# Patient Record
Sex: Female | Born: 1935 | Race: White | Hispanic: No | Marital: Married | Smoking: Never smoker
Health system: Southern US, Community
[De-identification: ages and names within clinical notes are randomized; demographics above are authoritative.]

## PROBLEM LIST (undated history)

## (undated) DIAGNOSIS — C50919 Malignant neoplasm of unspecified site of unspecified female breast: Secondary | ICD-10-CM

## (undated) DIAGNOSIS — E7800 Pure hypercholesterolemia, unspecified: Secondary | ICD-10-CM

## (undated) DIAGNOSIS — I10 Essential (primary) hypertension: Secondary | ICD-10-CM

## (undated) HISTORY — DX: Pure hypercholesterolemia, unspecified: E78.00

## (undated) HISTORY — PX: ABDOMINAL HYSTERECTOMY: SHX81

## (undated) HISTORY — DX: Essential (primary) hypertension: I10

## (undated) HISTORY — DX: Malignant neoplasm of unspecified site of unspecified female breast: C50.919

---

## 2011-08-09 HISTORY — PX: BREAST LUMPECTOMY: SHX2

## 2011-08-09 HISTORY — PX: BREAST BIOPSY: SHX20

## 2011-09-09 DIAGNOSIS — C50919 Malignant neoplasm of unspecified site of unspecified female breast: Secondary | ICD-10-CM

## 2011-09-09 HISTORY — DX: Malignant neoplasm of unspecified site of unspecified female breast: C50.919

## 2011-10-07 ENCOUNTER — Ambulatory Visit: Payer: Self-pay | Admitting: Oncology

## 2011-10-14 ENCOUNTER — Ambulatory Visit: Payer: Self-pay | Admitting: Internal Medicine

## 2011-11-07 ENCOUNTER — Ambulatory Visit: Payer: Self-pay | Admitting: Oncology

## 2011-11-07 ENCOUNTER — Ambulatory Visit: Payer: Self-pay | Admitting: Internal Medicine

## 2011-11-10 LAB — CBC CANCER CENTER
Basophil #: 0 x10 3/mm (ref 0.0–0.1)
Eosinophil #: 0.2 x10 3/mm (ref 0.0–0.7)
Eosinophil %: 2.9 %
HGB: 13.1 g/dL (ref 12.0–16.0)
Lymphocyte #: 1.2 x10 3/mm (ref 1.0–3.6)
MCH: 33 pg (ref 26.0–34.0)
MCHC: 34.5 g/dL (ref 32.0–36.0)
MCV: 96 fL (ref 80–100)
Monocyte #: 0.6 x10 3/mm (ref 0.0–0.7)
Neutrophil #: 4 x10 3/mm (ref 1.4–6.5)
Platelet: 263 x10 3/mm (ref 150–440)
RBC: 3.97 10*6/uL (ref 3.80–5.20)
RDW: 12.9 % (ref 11.5–14.5)

## 2011-11-17 LAB — CBC CANCER CENTER
Basophil #: 0 x10 3/mm (ref 0.0–0.1)
HCT: 36.2 % (ref 35.0–47.0)
HGB: 12.5 g/dL (ref 12.0–16.0)
Lymphocyte #: 1.1 x10 3/mm (ref 1.0–3.6)
MCH: 32.8 pg (ref 26.0–34.0)
Neutrophil #: 4.2 x10 3/mm (ref 1.4–6.5)
Platelet: 254 x10 3/mm (ref 150–440)
RBC: 3.8 10*6/uL (ref 3.80–5.20)
WBC: 6.2 x10 3/mm (ref 3.6–11.0)

## 2011-11-24 LAB — CBC CANCER CENTER
Basophil #: 0 x10 3/mm (ref 0.0–0.1)
Basophil %: 0.7 %
Eosinophil #: 0.2 x10 3/mm (ref 0.0–0.7)
Eosinophil %: 2.6 %
HGB: 12.4 g/dL (ref 12.0–16.0)
Lymphocyte %: 14.3 %
MCHC: 33.8 g/dL (ref 32.0–36.0)
Monocyte #: 0.8 x10 3/mm (ref 0.2–0.9)
Neutrophil #: 4.4 x10 3/mm (ref 1.4–6.5)
Neutrophil %: 70.2 %
RBC: 3.84 10*6/uL (ref 3.80–5.20)
RDW: 12.7 % (ref 11.5–14.5)
WBC: 6.3 x10 3/mm (ref 3.6–11.0)

## 2011-12-07 ENCOUNTER — Ambulatory Visit: Payer: Self-pay | Admitting: Internal Medicine

## 2011-12-07 ENCOUNTER — Ambulatory Visit: Payer: Self-pay | Admitting: Oncology

## 2011-12-08 LAB — CBC CANCER CENTER
Basophil #: 0 x10 3/mm (ref 0.0–0.1)
Basophil %: 0.7 %
Eosinophil #: 0.1 x10 3/mm (ref 0.0–0.7)
Eosinophil %: 2.9 %
HCT: 39.3 % (ref 35.0–47.0)
Lymphocyte %: 14.6 %
MCH: 32.3 pg (ref 26.0–34.0)
Monocyte %: 10.9 %
Neutrophil #: 3.6 x10 3/mm (ref 1.4–6.5)
Neutrophil %: 70.9 %
Platelet: 220 x10 3/mm (ref 150–440)
RDW: 12.7 % (ref 11.5–14.5)
WBC: 5 x10 3/mm (ref 3.6–11.0)

## 2012-01-07 ENCOUNTER — Ambulatory Visit: Payer: Self-pay | Admitting: Oncology

## 2012-01-07 ENCOUNTER — Ambulatory Visit: Payer: Self-pay | Admitting: Internal Medicine

## 2012-02-06 ENCOUNTER — Ambulatory Visit: Payer: Self-pay | Admitting: Internal Medicine

## 2012-04-11 ENCOUNTER — Ambulatory Visit: Payer: Self-pay | Admitting: Oncology

## 2012-05-08 ENCOUNTER — Ambulatory Visit: Payer: Self-pay | Admitting: Oncology

## 2012-06-08 ENCOUNTER — Ambulatory Visit: Payer: Self-pay | Admitting: Oncology

## 2012-07-25 ENCOUNTER — Ambulatory Visit: Payer: Self-pay | Admitting: Oncology

## 2012-08-08 ENCOUNTER — Ambulatory Visit: Payer: Self-pay | Admitting: Oncology

## 2012-09-12 ENCOUNTER — Ambulatory Visit: Payer: Self-pay | Admitting: Oncology

## 2012-10-31 ENCOUNTER — Ambulatory Visit: Payer: Self-pay | Admitting: Oncology

## 2012-11-06 ENCOUNTER — Ambulatory Visit: Payer: Self-pay | Admitting: Oncology

## 2012-12-06 ENCOUNTER — Ambulatory Visit: Payer: Self-pay | Admitting: Oncology

## 2013-02-13 ENCOUNTER — Ambulatory Visit: Payer: Self-pay | Admitting: Oncology

## 2013-03-08 ENCOUNTER — Ambulatory Visit: Payer: Self-pay | Admitting: Oncology

## 2013-06-13 IMAGING — CT CT SIM MISC
1 series · 16 of 33 positions shown, 20 images · non-contrast
Comparison: none

[Series 2: — · axial · 1.22mm/px · z∈[-880,-526]mm · 16 of 108 slices shown, 20 images]
[im 4/108  mediastinal]
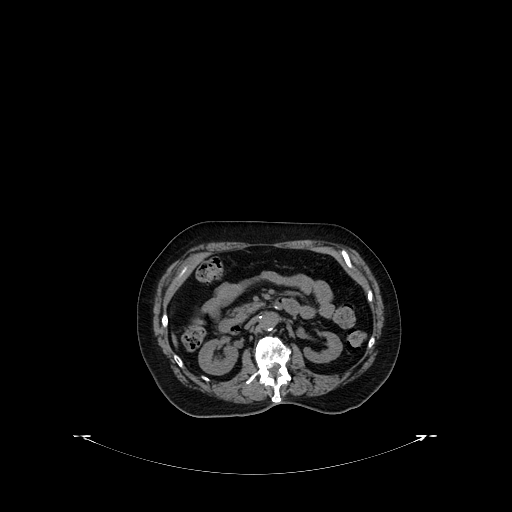
[im 4/108  lung]
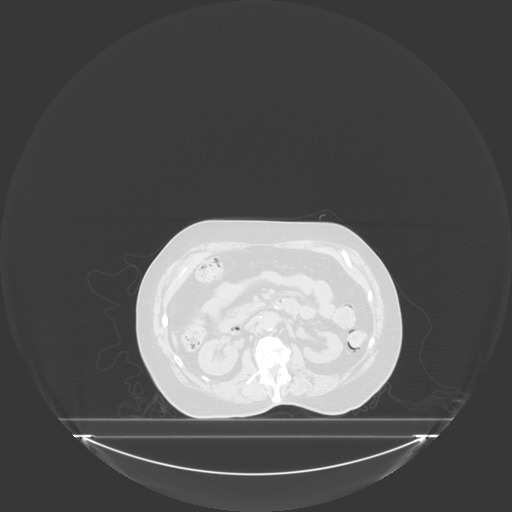
[im 12/108  lung]
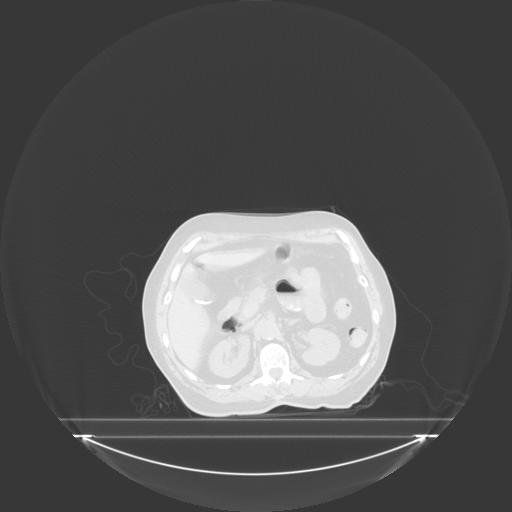
[im 20/108  lung]
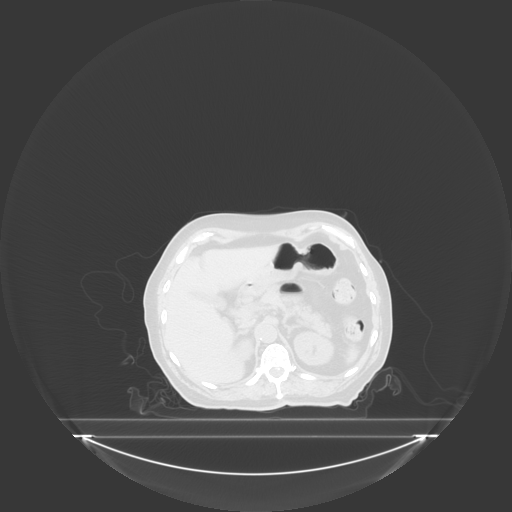
[im 24/108  lung]
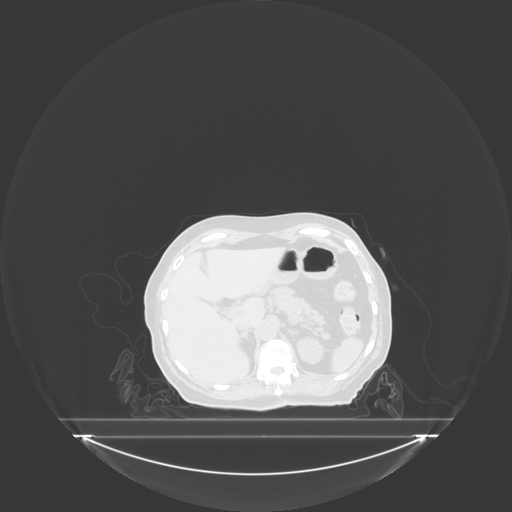
[im 32/108  mediastinal]
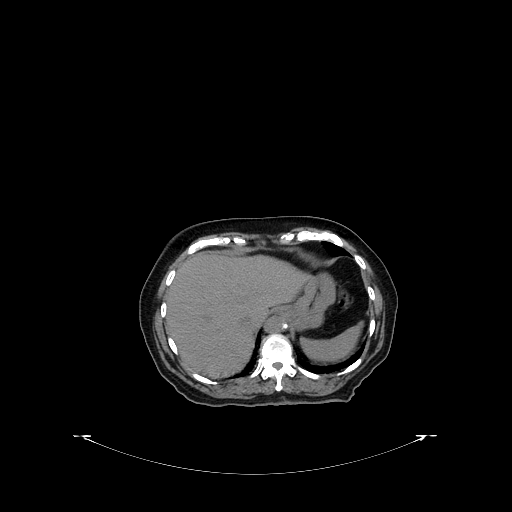
[im 32/108  lung]
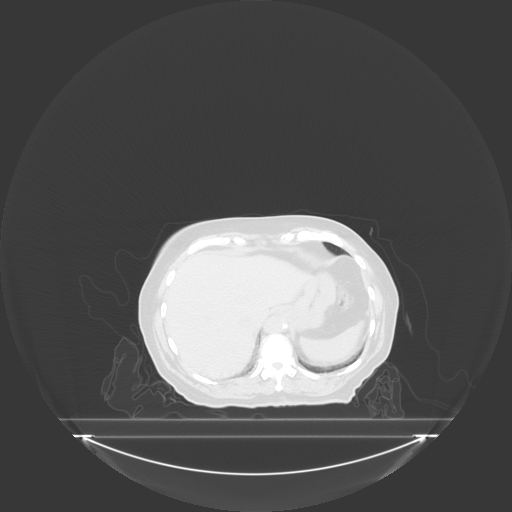
[im 40/108  lung]
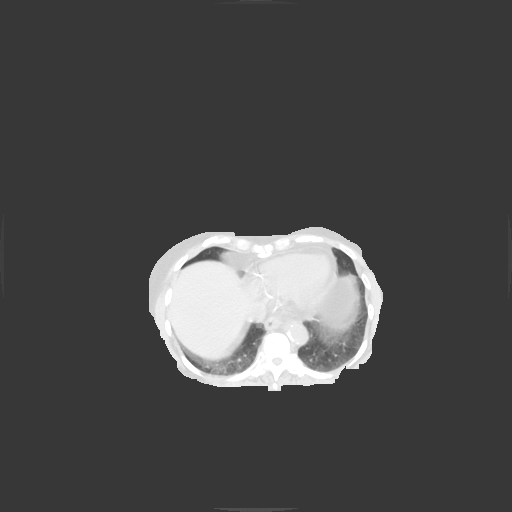
[im 44/108  lung]
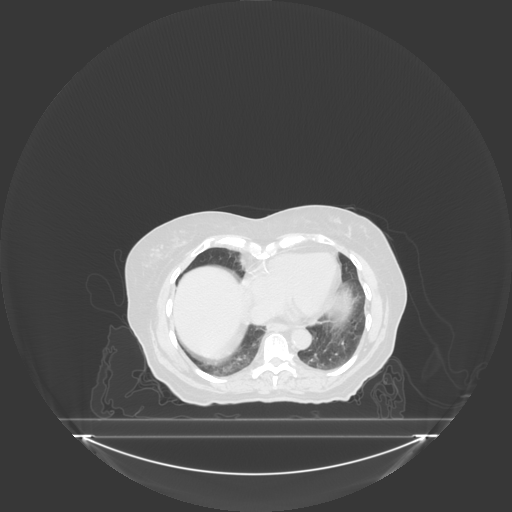
[im 52/108  lung]
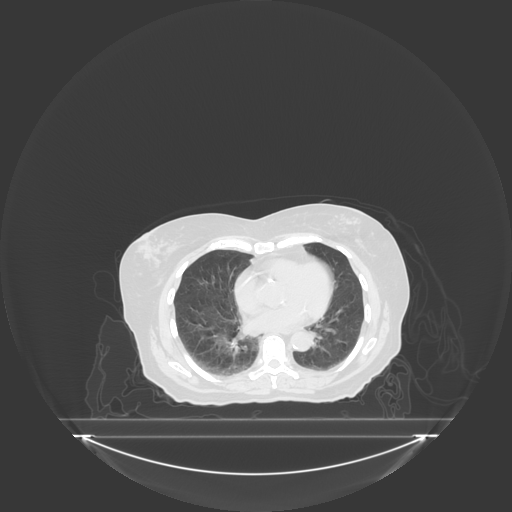
[im 60/108  mediastinal]
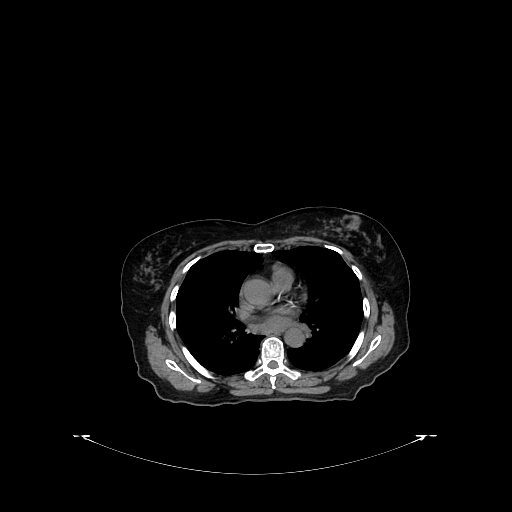
[im 60/108  lung]
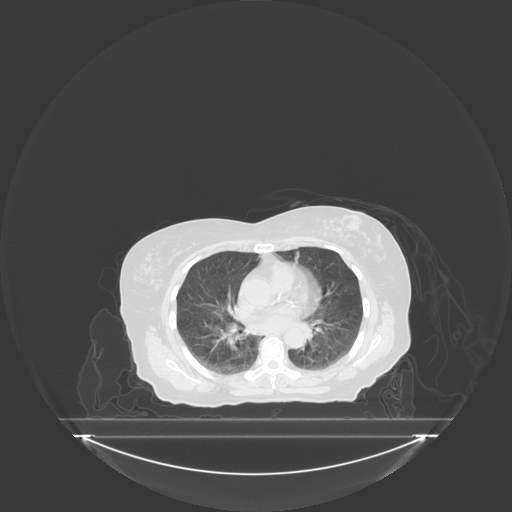
[im 64/108  lung]
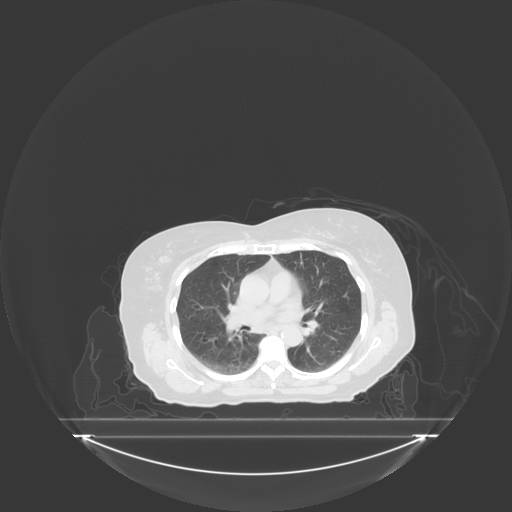
[im 68/108  lung]
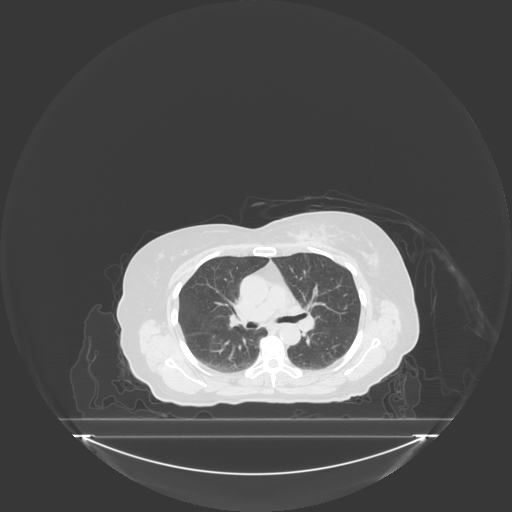
[im 76/108  lung]
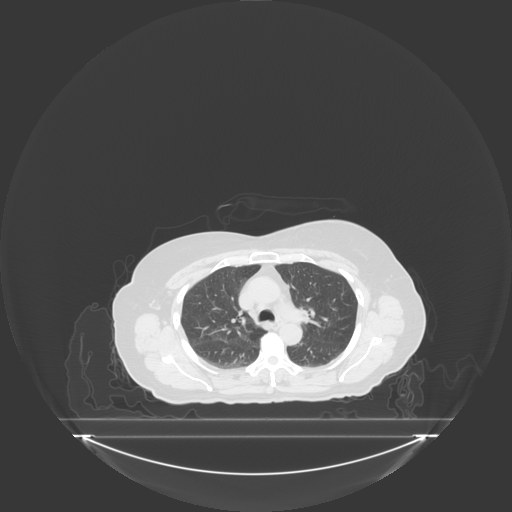
[im 84/108  mediastinal]
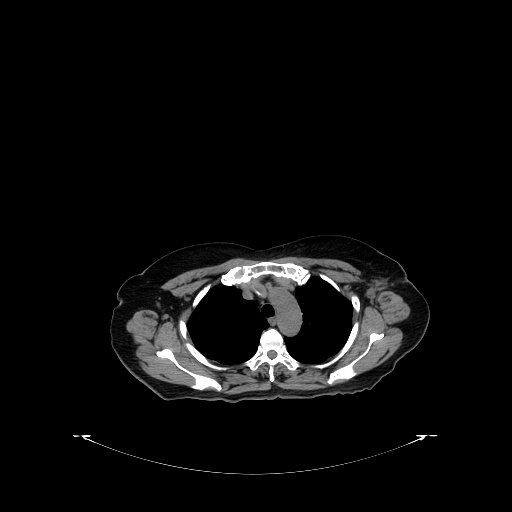
[im 84/108  lung]
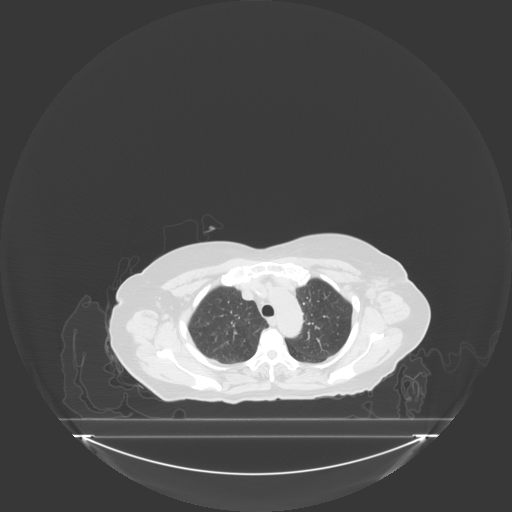
[im 88/108  lung]
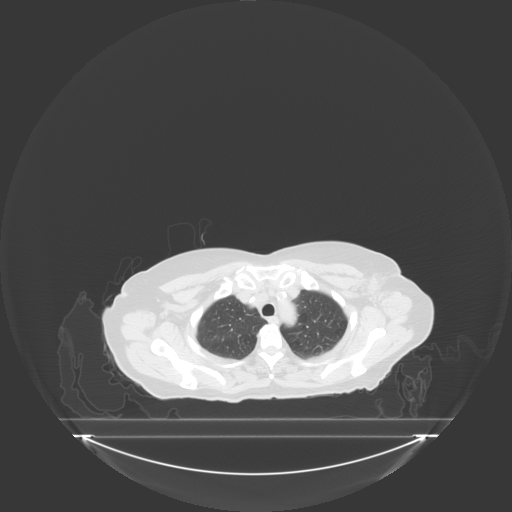
[im 96/108  lung]
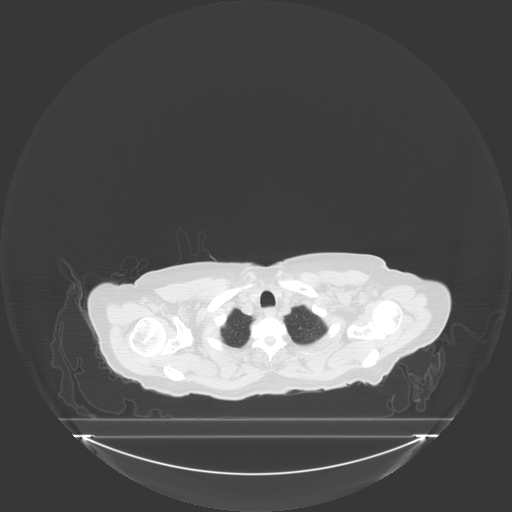
[im 104/108  lung]
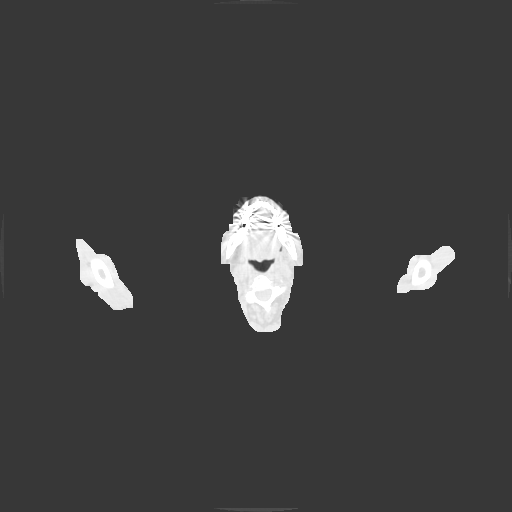

[16 of 33 positions shown; findings below may reference images not displayed]

IMAGES IMPORTED FROM THE [employer] WORKFLOW SYSTEM
NO DICTATION FOR STUDY

## 2013-06-26 ENCOUNTER — Ambulatory Visit: Payer: Self-pay | Admitting: Oncology

## 2013-06-26 LAB — BASIC METABOLIC PANEL
Anion Gap: 10 (ref 7–16)
BUN: 21 mg/dL — ABNORMAL HIGH (ref 7–18)
Co2: 28 mmol/L (ref 21–32)
Creatinine: 1.09 mg/dL (ref 0.60–1.30)
EGFR (African American): 56 — ABNORMAL LOW
Potassium: 4.4 mmol/L (ref 3.5–5.1)
Sodium: 139 mmol/L (ref 136–145)

## 2013-06-26 LAB — CBC CANCER CENTER
Eosinophil #: 0.1 x10 3/mm (ref 0.0–0.7)
HGB: 13 g/dL (ref 12.0–16.0)
Lymphocyte #: 1.5 x10 3/mm (ref 1.0–3.6)
Lymphocyte %: 25 %
MCH: 32.3 pg (ref 26.0–34.0)
MCHC: 33.5 g/dL (ref 32.0–36.0)
MCV: 96 fL (ref 80–100)
Monocyte #: 0.6 x10 3/mm (ref 0.2–0.9)
Neutrophil #: 3.6 x10 3/mm (ref 1.4–6.5)
Platelet: 239 x10 3/mm (ref 150–440)
RBC: 4.02 10*6/uL (ref 3.80–5.20)
RDW: 12.2 % (ref 11.5–14.5)
WBC: 5.8 x10 3/mm (ref 3.6–11.0)

## 2013-06-26 LAB — FERRITIN: Ferritin (ARMC): 53 ng/mL (ref 8–388)

## 2013-06-26 LAB — IRON AND TIBC
Iron Saturation: 34 %
Iron: 115 ug/dL (ref 50–170)
Unbound Iron-Bind.Cap.: 219 ug/dL

## 2013-06-26 LAB — FOLATE: Folic Acid: 57.7 ng/mL (ref 3.1–100.0)

## 2013-06-26 LAB — T4, FREE: Free Thyroxine: 0.94 ng/dL (ref 0.76–1.46)

## 2013-07-08 ENCOUNTER — Ambulatory Visit: Payer: Self-pay | Admitting: Oncology

## 2013-08-08 ENCOUNTER — Ambulatory Visit: Payer: Self-pay | Admitting: Oncology

## 2013-09-13 ENCOUNTER — Ambulatory Visit: Payer: Self-pay | Admitting: Oncology

## 2013-11-27 ENCOUNTER — Ambulatory Visit: Payer: Self-pay | Admitting: Oncology

## 2013-12-06 ENCOUNTER — Ambulatory Visit: Payer: Self-pay | Admitting: Oncology

## 2013-12-25 ENCOUNTER — Ambulatory Visit: Payer: Self-pay | Admitting: Oncology

## 2014-01-06 ENCOUNTER — Ambulatory Visit: Payer: Self-pay | Admitting: Oncology

## 2014-06-18 ENCOUNTER — Ambulatory Visit: Payer: Self-pay | Admitting: Oncology

## 2014-07-08 ENCOUNTER — Ambulatory Visit: Payer: Self-pay | Admitting: Oncology

## 2014-09-15 ENCOUNTER — Ambulatory Visit: Payer: Self-pay | Admitting: Oncology

## 2014-11-30 NOTE — Consult Note (Signed)
Reason for Visit: This 81 year old Female patient presents to the clinic for initial evaluation of  Breast cancer .   Referred by Dr. Grayland Ormond.  Diagnosis:   Chief Complaint/Diagnosis   81 year old female status post wide local excision and axillary node dissection for a stage I (T1 B. N0 M0) invasive mammary carcinoma lobular type ER/PR positive HER-2/neu negative   Pathology Report Pathology report reviewed    Imaging Report Mammograms requested for my review    Referral Report Clinical notes reviewed    Planned Treatment Regimen Adjuvant radiation therapy to her left breast and aromatase inhibitor    HPI   patient is a 81 year old female who presents with an abnormal mammogram of the left breast on screening mammography. She do not detect any mass or self. She underwent biopsy which was positive for infiltrating lobular carcinoma 1.6 x 0.9 cm. Margins were positive. One sentinel lymph node was negative. Tumor was ER/PR positive HER-2/neu negative. She underwent reexcision with axillary node dissection. No residual tumor was seen. 14 additional lymph nodes were negative for metastatic disease. Tumor was nuclear grade 2. She has done well postoperatively. She recently had bilateral cataract surgery and did well with that. She has been seen by medical oncology and is scheduled to undergo aromatase inhibitor therapy after completion of radiation. She seen today she is without complaint. She specifically denies breast tenderness cough or bone pain.  Past Hx:    Cancer, Left Breast:    Hypercholesterolemia:    Hypertension:    Bilateral Cataract Surgery January 2013:    Left breast biopsy:    Lumpectomy - Left:   Past, Family and Social History:   Past Medical History positive    Cardiovascular hyperlipidemia; hypertension    Gastrointestinal GERD    Past Surgical History Bilateral cataract surgery    Family History positive    Family History Comments Sister niece both  with breast cancer    Social History noncontributory    Additional Past Medical and Surgical History Accompanied by family member today   Allergies:   No Known Allergies:   Home Meds:  Home Medications: Medication Instructions Status  pravastatin 40 mg oral tablet 1 tab(s) orally once a day (at bedtime) Active  hydrochlorothiazide-losartan 12.5 mg-50 mg oral tablet 1 tab(s) orally once a day Active  ranitidine 150 mg oral capsule 1 cap(s) orally once a day Active  aspirin 81 mg oral tablet 1 tab(s) orally once a day Active  Centrum Silver oral tablet 1 tab(s) orally once a day Active  Benadryl 25 mg oral capsule 1 cap(s) orally once a day (at bedtime) Active   Review of Systems:   General negative    Performance Status (ECOG) 0    Skin negative    Breast see HPI    Ophthalmologic negative    ENMT negative    Respiratory and Thorax negative    Cardiovascular negative    Gastrointestinal negative    Genitourinary negative    Musculoskeletal negative    Neurological negative    Psychiatric negative    Hematology/Lymphatics negative    Endocrine negative    Allergic/Immunologic negative   Nursing Notes:  Nursing Vital Signs and Chemo Nursing Nursing Notes: *CC Vital Signs Flowsheet:   20-Mar-13 10:23   Temp Temperature 97.9   Pulse Pulse 66   Respirations Respirations 20   SBP SBP 124   DBP DBP 78   Pain Scale (0-10)  1   Current Weight (kg) (kg) 66.1  Height (cm) centimeters 163   Physical Exam:  General/Skin/HEENT:   General normal    Skin normal    Eyes normal    ENMT normal    Head and Neck normal    Additional PE Well-developed female in NAD. Lungs are clear to A&P cardiac examination shows regular rate and rhythm. She has retraction of her left nipple which as been present for years. Wide local excision scar the left breast is healed well. No dominant mass or nodularity is noted in either breast into position examined. No axillary or  supraclavicular adenopathy is noted bilaterally.   Breasts/Resp/CV/GI/GU:   Respiratory and Thorax normal    Cardiovascular normal    Gastrointestinal normal    Genitourinary normal   MS/Neuro/Psych/Lymph:   Musculoskeletal normal    Neurological normal    Lymphatics normal   Assessment and Plan:  Impression:   pathologic stage I invasive mammary carcinoma of lobular type status post wide local excision and axillary node dissection in 81 year old female  Plan:   this time I recommended whole breast radiation. I will plan on delivering 5000 cGy to her left breast and boost to her scar another 1400 cGy. I based my doses on [place]. guidelines. Risks and benefits of treatment including skin reaction as well as fatigue and slight exposure of superficial lung were all explained in detail to the patient. She seems to comprehend our treatment plan well. I have set her up for CT simulation later this week. She will also be a candidate for aromatase inhibitor after completion of radiation and I will refer her back to Dr. Grayland Ormond for that opinion at that time.  I would like to take this opportunity to thank you for allowing me to continue to participate in this patient's care.  CC Referral:   cc: Dr. Marcello Moores long, Dr. Rodman Key    [place]   Electronic Signatures: Baruch Gouty, Roda Shutters (MD)  (Signed 20-Mar-13 13:56)  Authored: HPI, Diagnosis, Past Hx, PFSH, Allergies, Home Meds, ROS, Nursing Notes, Physical Exam, Encounter Assessment and Plan, CC Referring Physician   Last Updated: 20-Mar-13 13:56 by Armstead Peaks (MD)

## 2014-12-04 ENCOUNTER — Ambulatory Visit
Admit: 2014-12-04 | Disposition: A | Discharge status: Home / Self Care | Payer: Self-pay | Attending: Radiation Oncology | Admitting: Radiation Oncology

## 2014-12-26 ENCOUNTER — Inpatient Hospital Stay: Payer: Medicare Other | Attending: Oncology | Admitting: Oncology

## 2014-12-26 VITALS — BP 132/62 | HR 81 | Temp 96.8°F | Resp 20 | Wt 135.1 lb

## 2014-12-26 DIAGNOSIS — Z7982 Long term (current) use of aspirin: Secondary | ICD-10-CM | POA: Diagnosis not present

## 2014-12-26 DIAGNOSIS — Z79811 Long term (current) use of aromatase inhibitors: Secondary | ICD-10-CM | POA: Diagnosis not present

## 2014-12-26 DIAGNOSIS — C50912 Malignant neoplasm of unspecified site of left female breast: Secondary | ICD-10-CM | POA: Diagnosis not present

## 2014-12-26 DIAGNOSIS — Z79899 Other long term (current) drug therapy: Secondary | ICD-10-CM | POA: Insufficient documentation

## 2014-12-26 DIAGNOSIS — Z170 Estrogen receptor positive status [ER+]: Secondary | ICD-10-CM | POA: Insufficient documentation

## 2015-01-09 DIAGNOSIS — C50912 Malignant neoplasm of unspecified site of left female breast: Secondary | ICD-10-CM | POA: Insufficient documentation

## 2015-01-09 DIAGNOSIS — Z170 Estrogen receptor positive status [ER+]: Secondary | ICD-10-CM

## 2015-01-09 NOTE — Progress Notes (Signed)
[facility]  Telephone:(336) [phone] Fax:(336) [phone]  ID: TISH BEGIN OB: [birth year]  MR#: [id number]  HQR#:[id number]  No care team member to display  CHIEF COMPLAINT:  Chief Complaint  Patient presents with  . Follow-up    breast cancer    INTERVAL HISTORY: Patient returns to clinic today for routine 6 month evaluation.  She currently feels well and is tolerating letrozole without significant side effects. She denies any recent fevers or illnesses.  She has a good appetite and denies weight loss.  She has no neurologic complaints.  She denies any chest pain or shortness of breath.  She has no nausea, vomiting, constipation, or diarrhea.  She has no urinary complaints.  Patient offers no specific complaints today.   REVIEW OF SYSTEMS:   Review of Systems  Constitutional: Negative.     As per HPI. Otherwise, a complete review of systems is negatve.  PAST MEDICAL HISTORY: No past medical history on file.  PAST SURGICAL HISTORY: No past surgical history on file.  FAMILY HISTORY No family history on file.     ADVANCED DIRECTIVES:    HEALTH MAINTENANCE: History  Substance Use Topics  . Smoking status: Not on file  . Smokeless tobacco: Not on file  . Alcohol Use: Not on file     Colonoscopy:  PAP:  Bone density:  Lipid panel:  Not on File  Current Outpatient Prescriptions  Medication Sig Dispense Refill  . aspirin 81 MG tablet Take 81 mg by mouth daily.    . diphenhydrAMINE (BENADRYL) 25 MG tablet Take 25 mg by mouth every 6 (six) hours as needed.    Marland Kitchen letrozole (FEMARA) 2.5 MG tablet     . losartan-hydrochlorothiazide (HYZAAR) 50-12.5 MG per tablet     . multivitamin-iron-minerals-folic acid (CENTRUM) chewable tablet Chew 1 tablet by mouth daily.    . pravastatin (PRAVACHOL) 40 MG tablet     . ranitidine (ZANTAC) 150 MG tablet      No current facility-administered medications for this visit.    OBJECTIVE: Filed Vitals:   12/26/14 1205  BP: 132/62  Pulse: 81  Temp: 96.8 F (36 C)  Resp: 20     There is no height on file to calculate BMI.    ECOG FS:0 - Asymptomatic  General: Well-developed, well-nourished, no acute distress. Eyes: anicteric sclera. Breasts: Bilateral breast and axilla without lumps or masses. Lungs: Clear to auscultation bilaterally. Heart: Regular rate and rhythm. No rubs, murmurs, or gallops. Abdomen: Soft, nontender, nondistended. No organomegaly noted, normoactive bowel sounds. Musculoskeletal: No edema, cyanosis, or clubbing. Neuro: Alert, answering all questions appropriately. Cranial nerves grossly intact. Skin: No rashes or petechiae noted. Psych: Normal affect.   LAB RESULTS:  Lab Results  Component Value Date   NA 139 06/26/2013   K 4.4 06/26/2013   CL 101 06/26/2013   CO2 28 06/26/2013   GLUCOSE 91 06/26/2013   BUN 21* 06/26/2013   CREATININE 1.09 06/26/2013   CALCIUM 10.1 06/26/2013   GFRNONAA 48* 06/26/2013   GFRAA 56* 06/26/2013    Lab Results  Component Value Date   WBC 5.8 06/26/2013   NEUTROABS 3.6 06/26/2013   HGB 13.0 06/26/2013   HCT 38.8 06/26/2013   MCV 96 06/26/2013   PLT 239 06/26/2013     STUDIES: No results found.  ASSESSMENT: Stage Ia ER/PR positive, HER-2 negative adenocarcinoma of the left breast.  PLAN:    1.  Breast cancer: No evidence of disease.  Continue letrozole daily completing  in May 2018.  Patient's most recent mammogram on September 15, 2014 was reported as BI-RADS 2.  Repeat in February 2017.  Return to clinic in 6 months for routine evaluation. 2. Bone density:  Bone mineral density in December 2014 has not been reported, but patient states she was told it was "normal".  Continue calcium and vitamin D.  Repeat in December 2016.  Patient expressed understanding and was in agreement with this plan. She also understands that She can call clinic at any time with any questions, concerns, or complaints.   Breast cancer    Staging form: Breast, AJCC 7th Edition     Clinical stage from 01/09/2015: Stage IA (T1c, N0, M0) - Signed by Lloyd Huger, MD on 01/09/2015   Lloyd Huger, MD   01/09/2015 4:31 PM

## 2015-02-10 ENCOUNTER — Other Ambulatory Visit: Payer: Self-pay | Admitting: Oncology

## 2015-02-10 ENCOUNTER — Telehealth: Payer: Self-pay | Admitting: *Deleted

## 2015-02-10 MED ORDER — LETROZOLE 2.5 MG PO TABS: 2.5000 mg | ORAL_TABLET | Freq: Every day | ORAL | Status: DC

## 2015-02-10 NOTE — Telephone Encounter (Signed)
Escribed

## 2015-06-12 ENCOUNTER — Encounter: Payer: Self-pay | Admitting: Oncology

## 2015-06-12 ENCOUNTER — Inpatient Hospital Stay: Payer: Medicare Other | Attending: Oncology | Admitting: Oncology

## 2015-06-12 VITALS — BP 142/60 | HR 63 | Temp 99.1°F | Resp 16 | Wt 134.5 lb

## 2015-06-12 DIAGNOSIS — Z803 Family history of malignant neoplasm of breast: Secondary | ICD-10-CM | POA: Diagnosis not present

## 2015-06-12 DIAGNOSIS — Z79811 Long term (current) use of aromatase inhibitors: Secondary | ICD-10-CM | POA: Insufficient documentation

## 2015-06-12 DIAGNOSIS — M2550 Pain in unspecified joint: Secondary | ICD-10-CM | POA: Insufficient documentation

## 2015-06-12 DIAGNOSIS — Z79899 Other long term (current) drug therapy: Secondary | ICD-10-CM | POA: Diagnosis not present

## 2015-06-12 DIAGNOSIS — I10 Essential (primary) hypertension: Secondary | ICD-10-CM | POA: Insufficient documentation

## 2015-06-12 DIAGNOSIS — C50912 Malignant neoplasm of unspecified site of left female breast: Secondary | ICD-10-CM | POA: Diagnosis present

## 2015-06-12 DIAGNOSIS — E7800 Pure hypercholesterolemia, unspecified: Secondary | ICD-10-CM

## 2015-06-12 DIAGNOSIS — Z170 Estrogen receptor positive status [ER+]: Secondary | ICD-10-CM | POA: Diagnosis not present

## 2015-06-12 DIAGNOSIS — Z7982 Long term (current) use of aspirin: Secondary | ICD-10-CM | POA: Insufficient documentation

## 2015-06-12 NOTE — Progress Notes (Signed)
Patient is scheduled for knee replacement surgery on 06/23/15.

## 2015-06-19 NOTE — Progress Notes (Signed)
[place]  Telephone:(336) [phone] Fax:(336) [phone]  ID: Sarah Schneider OB: [birth year]  MR#: [id number]  LFY#:[id number]  No care team member to display  CHIEF COMPLAINT:  Chief Complaint  Patient presents with  . Breast Cancer    INTERVAL HISTORY: Patient returns to clinic today for routine 6 month evaluation.  She continues to feel well and is asymptomatic. He is tolerating letrozole without significant side effects. She has a knee replacement scheduled for June 23, 2015. denies any recent fevers or illnesses.  She has a good appetite and denies weight loss.  She has no neurologic complaints.  She denies any chest pain or shortness of breath.  She has no nausea, vomiting, constipation, or diarrhea.  She has no urinary complaints.  Patient offers no specific complaints today.   REVIEW OF SYSTEMS:   Review of Systems  Constitutional: Negative.   Cardiovascular: Negative.   Gastrointestinal: Negative.   Musculoskeletal: Positive for joint pain.  Neurological: Negative.     As per HPI. Otherwise, a complete review of systems is negatve.  PAST MEDICAL HISTORY: Past Medical History  Diagnosis Date  . Breast cancer ([place])   . Hypertension   . Hypercholesteremia     PAST SURGICAL HISTORY: Past Surgical History  Procedure Laterality Date  . Abdominal hysterectomy      FAMILY HISTORY Family History  Problem Relation Age of Onset  . Breast cancer Sister   . Osteoarthritis Sister   . Breast cancer Other     neice       ADVANCED DIRECTIVES:    HEALTH MAINTENANCE: Social History  Substance Use Topics  . Smoking status: Never Smoker   . Smokeless tobacco: Never Used  . Alcohol Use: No     Colonoscopy:  PAP:  Bone density:  Lipid panel:  No Known Allergies  Current Outpatient Prescriptions  Medication Sig Dispense Refill  . aspirin 81 MG tablet Take 81 mg by mouth daily.    . diphenhydrAMINE (BENADRYL) 25 MG tablet Take 25 mg by  mouth every 6 (six) hours as needed.    Marland Kitchen letrozole (FEMARA) 2.5 MG tablet Take 1 tablet (2.5 mg total) by mouth daily. 90 tablet 1  . losartan-hydrochlorothiazide (HYZAAR) 50-12.5 MG per tablet     . multivitamin-iron-minerals-folic acid (CENTRUM) chewable tablet Chew 1 tablet by mouth daily.    . pravastatin (PRAVACHOL) 40 MG tablet     . ranitidine (ZANTAC) 150 MG tablet      No current facility-administered medications for this visit.    OBJECTIVE: Filed Vitals:   06/12/15 1044  BP: 142/60  Pulse: 63  Temp: 99.1 F (37.3 C)  Resp: 16     There is no height on file to calculate BMI.    ECOG FS:0 - Asymptomatic  General: Well-developed, well-nourished, no acute distress. Eyes: anicteric sclera. Breasts: Bilateral breast and axilla without lumps or masses. Lungs: Clear to auscultation bilaterally. Heart: Regular rate and rhythm. No rubs, murmurs, or gallops. Abdomen: Soft, nontender, nondistended. No organomegaly noted, normoactive bowel sounds. Musculoskeletal: No edema, cyanosis, or clubbing. Neuro: Alert, answering all questions appropriately. Cranial nerves grossly intact. Skin: No rashes or petechiae noted. Psych: Normal affect.   LAB RESULTS:  Lab Results  Component Value Date   NA 139 06/26/2013   K 4.4 06/26/2013   CL 101 06/26/2013   CO2 28 06/26/2013   GLUCOSE 91 06/26/2013   BUN 21* 06/26/2013   CREATININE 1.09 06/26/2013   CALCIUM 10.1 06/26/2013  GFRNONAA 48* 06/26/2013   GFRAA 56* 06/26/2013    Lab Results  Component Value Date   WBC 5.8 06/26/2013   NEUTROABS 3.6 06/26/2013   HGB 13.0 06/26/2013   HCT 38.8 06/26/2013   MCV 96 06/26/2013   PLT 239 06/26/2013     STUDIES: No results found.  ASSESSMENT: Stage Ia ER/PR positive, HER-2 negative adenocarcinoma of the left breast.  PLAN:    1.  Breast cancer: No evidence of disease.  Continue letrozole daily completing in May 2018.  Patient's most recent mammogram on September 15, 2014 was  reported as BI-RADS 2.  Repeat in February 2017.  Return to clinic in 6 months for routine evaluation. 2. Bone density:  Bone mineral density in December 2014 has not been reported, but patient states she was told it was "normal".  Continue calcium and vitamin D.  Repeat in December 2016. 3.  Knee replacement: Treatment per orthopedics.  Patient expressed understanding and was in agreement with this plan. She also understands that She can call clinic at any time with any questions, concerns, or complaints.   Breast cancer   Staging form: Breast, AJCC 7th Edition     Clinical stage from 01/09/2015: Stage IA (T1c, N0, M0) - Signed by Lloyd Huger, MD on 01/09/2015   Lloyd Huger, MD   06/19/2015 6:29 AM

## 2015-06-26 ENCOUNTER — Ambulatory Visit: Payer: Medicare Other | Admitting: Oncology

## 2015-08-07 ENCOUNTER — Telehealth: Payer: Self-pay | Admitting: *Deleted

## 2015-08-07 MED ORDER — LETROZOLE 2.5 MG PO TABS: 2.5000 mg | ORAL_TABLET | Freq: Every day | ORAL | Status: DC

## 2015-08-07 NOTE — Telephone Encounter (Signed)
Escribed

## 2015-08-11 ENCOUNTER — Telehealth: Payer: Self-pay | Admitting: *Deleted

## 2015-08-11 MED ORDER — LETROZOLE 2.5 MG PO TABS: 2.5000 mg | ORAL_TABLET | Freq: Every day | ORAL | Status: DC

## 2015-08-11 NOTE — Telephone Encounter (Signed)
Escribed

## 2015-09-21 ENCOUNTER — Ambulatory Visit
Admission: RE | Admit: 2015-09-21 | Discharge: 2015-09-21 | Disposition: A | Discharge status: Home / Self Care | Payer: Medicare HMO | Source: Ambulatory Visit | Attending: Oncology | Admitting: Oncology

## 2015-09-21 ENCOUNTER — Ambulatory Visit
Admission: RE | Admit: 2015-09-21 | Discharge: 2015-09-21 | Disposition: A | Discharge status: Home / Self Care | Payer: Medicare Other | Source: Ambulatory Visit | Attending: Oncology | Admitting: Oncology

## 2015-09-21 DIAGNOSIS — C50912 Malignant neoplasm of unspecified site of left female breast: Secondary | ICD-10-CM

## 2015-09-21 DIAGNOSIS — Z9889 Other specified postprocedural states: Secondary | ICD-10-CM | POA: Insufficient documentation

## 2015-09-21 DIAGNOSIS — Z853 Personal history of malignant neoplasm of breast: Secondary | ICD-10-CM | POA: Diagnosis not present

## 2015-12-11 ENCOUNTER — Inpatient Hospital Stay: Payer: Medicare Other | Attending: Oncology | Admitting: Oncology

## 2015-12-11 VITALS — BP 120/73 | HR 84 | Temp 97.1°F | Resp 16

## 2015-12-11 DIAGNOSIS — Z170 Estrogen receptor positive status [ER+]: Secondary | ICD-10-CM | POA: Diagnosis not present

## 2015-12-11 DIAGNOSIS — M2550 Pain in unspecified joint: Secondary | ICD-10-CM | POA: Insufficient documentation

## 2015-12-11 DIAGNOSIS — I10 Essential (primary) hypertension: Secondary | ICD-10-CM | POA: Diagnosis not present

## 2015-12-11 DIAGNOSIS — Z79899 Other long term (current) drug therapy: Secondary | ICD-10-CM | POA: Diagnosis not present

## 2015-12-11 DIAGNOSIS — E7800 Pure hypercholesterolemia, unspecified: Secondary | ICD-10-CM | POA: Diagnosis not present

## 2015-12-11 DIAGNOSIS — C50912 Malignant neoplasm of unspecified site of left female breast: Secondary | ICD-10-CM

## 2015-12-11 DIAGNOSIS — Z79811 Long term (current) use of aromatase inhibitors: Secondary | ICD-10-CM | POA: Insufficient documentation

## 2015-12-11 DIAGNOSIS — Z803 Family history of malignant neoplasm of breast: Secondary | ICD-10-CM | POA: Diagnosis not present

## 2015-12-11 DIAGNOSIS — Z7982 Long term (current) use of aspirin: Secondary | ICD-10-CM | POA: Insufficient documentation

## 2015-12-11 NOTE — Progress Notes (Signed)
Patient does not offer any problems today. She is scheduled for left knee surgery on 12/29/15.

## 2015-12-11 NOTE — Progress Notes (Signed)
[place]  Telephone:(336) [phone] Fax:(336) [phone]  ID: Sarah Schneider OB: [birth year]  MR#: [id number]  OQH#:[id number]  No care team member to display  CHIEF COMPLAINT:  Chief Complaint  Patient presents with  . Breast Cancer    INTERVAL HISTORY: Patient returns to clinic today for routine 6 month evaluation.  She continues to feel well and is asymptomatic. He is tolerating letrozole without significant side effects. Her knee replacement scheduled last fall was postponed and is planned for the next several weeks. She denies any recent fevers or illnesses.  She has a good appetite and denies weight loss.  She has no neurologic complaints.  She denies any chest pain or shortness of breath.  She has no nausea, vomiting, constipation, or diarrhea.  She has no urinary complaints.  Patient offers no further specific complaints today.   REVIEW OF SYSTEMS:   Review of Systems  Constitutional: Negative.  Negative for fever, weight loss and malaise/fatigue.  Respiratory: Negative.  Negative for shortness of breath.   Cardiovascular: Negative.  Negative for chest pain.  Gastrointestinal: Negative.   Musculoskeletal: Positive for joint pain.  Neurological: Negative.  Negative for weakness.  Psychiatric/Behavioral: Negative.     As per HPI. Otherwise, a complete review of systems is negatve.  PAST MEDICAL HISTORY: Past Medical History  Diagnosis Date  . Hypertension   . Hypercholesteremia   . Breast cancer ([place]) 09/2011    Left breast, lumpectomy & radiation treatment    PAST SURGICAL HISTORY: Past Surgical History  Procedure Laterality Date  . Abdominal hysterectomy      FAMILY HISTORY Family History  Problem Relation Age of Onset  . Breast cancer Sister   . Osteoarthritis Sister   . Breast cancer Other     neice       ADVANCED DIRECTIVES:    HEALTH MAINTENANCE: Social History  Substance Use Topics  . Smoking status: Never Smoker   .  Smokeless tobacco: Never Used  . Alcohol Use: No     Colonoscopy:  PAP:  Bone density:  Lipid panel:  No Known Allergies  Current Outpatient Prescriptions  Medication Sig Dispense Refill  . aspirin 81 MG tablet Take 81 mg by mouth daily.    . Calcium-Vitamin D 600-200 MG-UNIT tablet Take by mouth.    . diphenhydrAMINE (BENADRYL) 25 MG tablet Take 25 mg by mouth every 6 (six) hours as needed.    Marland Kitchen letrozole (FEMARA) 2.5 MG tablet Take 1 tablet (2.5 mg total) by mouth daily. 90 tablet 0  . losartan-hydrochlorothiazide (HYZAAR) 50-12.5 MG per tablet     . multivitamin-iron-minerals-folic acid (CENTRUM) chewable tablet Chew 1 tablet by mouth daily.    . Omega-3 Fatty Acids (FISH OIL) 1000 MG CAPS Take by mouth.    . pravastatin (PRAVACHOL) 40 MG tablet     . ranitidine (ZANTAC) 150 MG tablet      No current facility-administered medications for this visit.    OBJECTIVE: Filed Vitals:   12/11/15 1029  BP: 120/73  Pulse: 84  Temp: 97.1 F (36.2 C)  Resp: 16     There is no height or weight on file to calculate BMI.    ECOG FS:0 - Asymptomatic  General: Well-developed, well-nourished, no acute distress. Eyes: anicteric sclera. Breasts: Bilateral breast and axilla without lumps or masses. Lungs: Clear to auscultation bilaterally. Heart: Regular rate and rhythm. No rubs, murmurs, or gallops. Abdomen: Soft, nontender, nondistended. No organomegaly noted, normoactive bowel sounds. Musculoskeletal: No edema, cyanosis,  or clubbing. Neuro: Alert, answering all questions appropriately. Cranial nerves grossly intact. Skin: No rashes or petechiae noted. Psych: Normal affect.   LAB RESULTS:  Lab Results  Component Value Date   NA 139 06/26/2013   K 4.4 06/26/2013   CL 101 06/26/2013   CO2 28 06/26/2013   GLUCOSE 91 06/26/2013   BUN 21* 06/26/2013   CREATININE 1.09 06/26/2013   CALCIUM 10.1 06/26/2013   GFRNONAA 48* 06/26/2013   GFRAA 56* 06/26/2013    Lab Results    Component Value Date   WBC 5.8 06/26/2013   NEUTROABS 3.6 06/26/2013   HGB 13.0 06/26/2013   HCT 38.8 06/26/2013   MCV 96 06/26/2013   PLT 239 06/26/2013     STUDIES: No results found.  ASSESSMENT: Stage Ia ER/PR positive, HER-2 negative adenocarcinoma of the left breast.  PLAN:    1.  Breast cancer: No evidence of disease.  Continue letrozole daily completing in May 2018.  Patient's most recent mammogram on September 21, 2015 was reported as BI-RADS 2.  Repeat in February 2018.  Return to clinic in 6 months for routine evaluation. 2. Bone density:  Bone mineral density in December 2014 has not been reported, but patient states she was told it was "normal".  Continue calcium and vitamin D.  She missed her most recent appointment for DEXA can, but wishes to postpone until after her knee replacement. DEXA scan has been scheduled for 6 months just prior to her next clinical appointment. 3.  Knee replacement: Treatment per orthopedics.  Patient expressed understanding and was in agreement with this plan. She also understands that She can call clinic at any time with any questions, concerns, or complaints.   Breast cancer   Staging form: Breast, AJCC 7th Edition     Clinical stage from 01/09/2015: Stage IA (T1c, N0, M0) - Signed by Lloyd Huger, MD on 01/09/2015   Lloyd Huger, MD   12/11/2015 3:47 PM

## 2016-05-09 ENCOUNTER — Other Ambulatory Visit: Payer: Self-pay | Admitting: *Deleted

## 2016-05-09 MED ORDER — LETROZOLE 2.5 MG PO TABS: 2.5000 mg | ORAL_TABLET | Freq: Every day | ORAL | 0 refills | Status: DC

## 2016-06-14 ENCOUNTER — Encounter (INDEPENDENT_AMBULATORY_CARE_PROVIDER_SITE_OTHER): Payer: Self-pay

## 2016-06-14 ENCOUNTER — Ambulatory Visit
Admission: RE | Admit: 2016-06-14 | Discharge: 2016-06-14 | Disposition: A | Discharge status: Home / Self Care | Payer: Medicare HMO | Source: Ambulatory Visit | Attending: Oncology | Admitting: Oncology

## 2016-06-14 DIAGNOSIS — C50912 Malignant neoplasm of unspecified site of left female breast: Secondary | ICD-10-CM | POA: Diagnosis not present

## 2016-06-17 ENCOUNTER — Ambulatory Visit: Payer: Medicare Other | Admitting: Oncology

## 2016-07-07 NOTE — Progress Notes (Signed)
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  INTERVAL HISTORY: Patient returns to clinic today for routine 6 month evaluation.  She continues to feel well and is asymptomatic. He is tolerating letrozole without significant side effects. She has her left knee replaced within the last several months and no longer has joint pain. She denies any recent fevers or illnesses.  She has a good appetite and denies weight loss.  She has no neurologic complaints.  She denies any chest pain or shortness of breath.  She has no nausea, vomiting, constipation, or diarrhea.  She has no urinary complaints.  Patient offers no further specific complaints today.   REVIEW OF SYSTEMS:   Review of Systems  Constitutional: Negative.  Negative for fever, malaise/fatigue and weight loss.  Respiratory: Negative.  Negative for cough and shortness of breath.   Cardiovascular: Negative.  Negative for chest pain and leg swelling.  Gastrointestinal: Negative.  Negative for abdominal pain.  Genitourinary: Negative.   Musculoskeletal: Negative for joint pain.  Neurological: Negative.  Negative for sensory change and weakness.  Psychiatric/Behavioral: Negative.  The patient is not nervous/anxious.     As per HPI. Otherwise, a complete review of systems is negative.  PAST MEDICAL HISTORY: Past Medical History:  Diagnosis Date  . Breast cancer ([place]) 09/2011   Left breast, lumpectomy & radiation treatment  . Hypercholesteremia   . Hypertension     PAST SURGICAL HISTORY: Past Surgical History:  Procedure Laterality Date  . ABDOMINAL HYSTERECTOMY      FAMILY HISTORY Family History  Problem Relation Age of Onset  . Breast cancer  Sister   . Osteoarthritis Sister   . Breast cancer Other     neice       ADVANCED DIRECTIVES:    HEALTH MAINTENANCE: Social History  Substance Use Topics  . Smoking status: Never Smoker  . Smokeless tobacco: Never Used  . Alcohol use No     Colonoscopy:  PAP:  Bone density:  Lipid panel:  No Known Allergies  Current Outpatient Prescriptions  Medication Sig Dispense Refill  . aspirin 81 MG tablet Take 81 mg by mouth daily.    . Calcium-Vitamin D 600-200 MG-UNIT tablet Take by mouth.    . diphenhydrAMINE (BENADRYL) 25 MG tablet Take 25 mg by mouth every 6 (six) hours as needed.    Marland Kitchen letrozole (FEMARA) 2.5 MG tablet Take 1 tablet (2.5 mg total) by mouth daily. 90 tablet 0  . losartan-hydrochlorothiazide (HYZAAR) 50-12.5 MG per tablet     . multivitamin-iron-minerals-folic acid (CENTRUM) chewable tablet Chew 1 tablet by mouth daily.    . Omega-3 Fatty Acids (FISH OIL) 1000 MG CAPS Take by mouth.    . pravastatin (PRAVACHOL) 40 MG tablet     . ranitidine (ZANTAC) 150 MG tablet      No current facility-administered medications for this visit.     OBJECTIVE: Vitals:   07/08/16 0954  BP: 123/71  Pulse: 81  Resp: 18  Temp: 97.8 F (36.6 C)     There is no height or weight on file to calculate BMI.    ECOG FS:0 - Asymptomatic  General: Well-developed, well-nourished, no acute distress. Eyes: anicteric sclera. Breasts: Bilateral breast and axilla without lumps or masses. Lungs:  Clear to auscultation bilaterally. Heart: Regular rate and rhythm. No rubs, murmurs, or gallops. Abdomen: Soft, nontender, nondistended. No organomegaly noted, normoactive bowel sounds. Musculoskeletal: No edema, cyanosis, or clubbing. Neuro: Alert, answering all questions appropriately. Cranial nerves grossly intact. Skin: No rashes or petechiae noted. Psych: Normal affect.   LAB RESULTS:  Lab Results  Component Value Date   NA 139 06/26/2013   K 4.4 06/26/2013   CL 101 06/26/2013    CO2 28 06/26/2013   GLUCOSE 91 06/26/2013   BUN 21 (H) 06/26/2013   CREATININE 1.09 06/26/2013   CALCIUM 10.1 06/26/2013   GFRNONAA 48 (L) 06/26/2013   GFRAA 56 (L) 06/26/2013    Lab Results  Component Value Date   WBC 5.8 06/26/2013   NEUTROABS 3.6 06/26/2013   HGB 13.0 06/26/2013   HCT 38.8 06/26/2013   MCV 96 06/26/2013   PLT 239 06/26/2013     STUDIES: Dg Bone Density  Result Date: 06/16/2016 EXAM: DUAL X-RAY ABSORPTIOMETRY (DXA) FOR BONE MINERAL DENSITY IMPRESSION: Dear Dr Delight Hoh, Your patient Sarah Schneider completed a BMD test on 06/14/2016 using the [place] (analysis version: 14.10) manufactured by [employer]. The following summarizes the results of our evaluation. PATIENT BIOGRAPHICAL: Name: Sarah Schneider, Sarah Schneider Patient ID: [id number] Birth Date: 1931/05/09 Height: 62.0 in. Gender: Female Exam Date: 06/14/2016 Weight: 125.8 lbs. Indications: Advanced Age, Caucasian, Family History of Fracture, Height Loss, high risk meds, hx breast ca, Hysterectomy, Low Body Weight, POSTmenopausal Fractures: Treatments: 81 MG ASPIRIN, Calcium, letrozole, multivitamin ASSESSMENT: The BMD measured at Femur Neck Left is 0.913 g/cm2 with a T-score of -0.9. This patient is considered normal according to [facility]) criteria. Patient is not a candidate for Frax assessment due to normal report. Site Region Measured Measured WHO Young Adult BMD Date       Age      Classification T-score DualFemur Neck Left 06/14/2016 83.0 Normal -0.9 0.913 g/cm2 DualFemur Neck Left 07/09/2013 80.1 Normal -0.9 0.907 g/cm2 AP Spine L1-L4 06/14/2016 83.0 Normal 3.9 1.648 g/cm2 AP Spine L1-L4 07/09/2013 80.1 Normal 3.8 1.665 g/cm2 World Health Organization [facility]) criteria for post-menopausal, Caucasian Women: Normal:       T-score at or above -1 SD Osteopenia:   T-score between -1 and -2.5 SD Osteoporosis: T-score at or below -2.5 SD RECOMMENDATIONS: [facility] recommends that FDA-approved medical therapies be considered in postemenopausal women and men age 75 or older with a: 1. Hip or vertebral (clinical or morphometric) fracture. 2. T-score of < -2.5at the spine or hip. 3. Ten-year fracture probability by FRAX of 3% or greater for hip fracture or 20% or greater for major osteoporotic fracture. All treatment decisions require clinical judgment and consideration of individual patient factors, including patient preferences, co-morbidities, previous drug use, risk factors not captured in the FRAX model (e.g. falls, vitamin D deficiency, increased bone turnover, interval significant decline in bone density) and possible under - or over-estimation of fracture risk by FRAX. All patients should ensure an adequate intake of dietary calcium (1200 mg/d) and vitamin D (800 IU daily) unless contraindicated. FOLLOW-UP: People with diagnosed cases of osteoporosis or at high risk for fracture should have regular bone mineral density tests. For patients eligible for Medicare, routine testing is allowed once every 2 years. The testing frequency can be increased to one year for patients who have rapidly progressing disease, those who are receiving or discontinuing medical therapy to restore bone mass, or have additional risk factors. I  have reviewed this report, and agree with the above findings. [facility] Electronically Signed   By: Lowella Grip III M.D.   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  PLAN:    1. 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  Continue letrozole daily completing in May 2018.  Patient's most recent mammogram on September 21, 2015 was reported as BI-RADS 2.  Repeat in February 2018.  Return to clinic in 6 months for routine evaluation. 2. Bone density:  Dexa scan from  November 2017 is normal. Repeat in 2 years.  Continue calcium and vitamin D.  3.  Knee replacement: Treatment per orthopedics. Had Left knee replaced a few months ago and reports decreased pain.   Patient expressed understanding and was in agreement with this plan. She also understands that She can call clinic at any time with any questions, concerns, or complaints.   Breast cancer   Staging form: Breast, AJCC 7th Edition     Clinical stage from 01/09/2015: Stage IA (T1c, N0, M0) - Signed by Lloyd Huger, MD on 01/09/2015  Sarah Casa, NP 07/08/2016 12:33PM                       Patient was seen and evaluated independently and I agree with the assessment and plan as dictated above.  T-score reported as -0.9. Repeat in November 2019.  Lloyd Huger, MD 07/11/16 10:09 PM

## 2016-07-08 ENCOUNTER — Inpatient Hospital Stay: Payer: Medicare HMO | Attending: Oncology | Admitting: Oncology

## 2016-07-08 VITALS — BP 123/71 | HR 81 | Temp 97.8°F | Resp 18 | Wt 128.1 lb

## 2016-07-08 DIAGNOSIS — C50912 Malignant neoplasm of unspecified site of left female breast: Secondary | ICD-10-CM | POA: Diagnosis not present

## 2016-07-08 DIAGNOSIS — Z170 Estrogen receptor positive status [ER+]: Secondary | ICD-10-CM | POA: Insufficient documentation

## 2016-07-08 DIAGNOSIS — Z803 Family history of malignant neoplasm of breast: Secondary | ICD-10-CM | POA: Insufficient documentation

## 2016-07-08 DIAGNOSIS — Z79899 Other long term (current) drug therapy: Secondary | ICD-10-CM | POA: Diagnosis not present

## 2016-07-08 DIAGNOSIS — Z79811 Long term (current) use of aromatase inhibitors: Secondary | ICD-10-CM | POA: Insufficient documentation

## 2016-07-08 DIAGNOSIS — E7800 Pure hypercholesterolemia, unspecified: Secondary | ICD-10-CM | POA: Diagnosis not present

## 2016-07-08 DIAGNOSIS — I10 Essential (primary) hypertension: Secondary | ICD-10-CM

## 2016-07-08 DIAGNOSIS — Z7982 Long term (current) use of aspirin: Secondary | ICD-10-CM | POA: Insufficient documentation

## 2016-07-08 DIAGNOSIS — Z90710 Acquired absence of both cervix and uterus: Secondary | ICD-10-CM | POA: Diagnosis not present

## 2016-07-08 NOTE — Progress Notes (Signed)
Offers no complaints. Feeling well. 

## 2016-09-09 ENCOUNTER — Telehealth: Payer: Self-pay | Admitting: *Deleted

## 2016-09-09 DIAGNOSIS — Z170 Estrogen receptor positive status [ER+]: Principal | ICD-10-CM

## 2016-09-09 DIAGNOSIS — C50912 Malignant neoplasm of unspecified site of left female breast: Secondary | ICD-10-CM

## 2016-09-09 MED ORDER — LETROZOLE 2.5 MG PO TABS: 2.5000 mg | ORAL_TABLET | Freq: Every day | ORAL | 0 refills | Status: DC

## 2016-09-09 NOTE — Telephone Encounter (Signed)
Pt to finish letrozole in May 2018. 3 month supply letrozole escribed to pharmacy.

## 2016-09-22 ENCOUNTER — Ambulatory Visit
Admission: RE | Admit: 2016-09-22 | Discharge: 2016-09-22 | Disposition: A | Discharge status: Home / Self Care | Payer: Medicare HMO | Source: Ambulatory Visit | Attending: Oncology | Admitting: Oncology

## 2016-09-22 ENCOUNTER — Other Ambulatory Visit: Payer: Self-pay | Admitting: Oncology

## 2016-09-22 DIAGNOSIS — R921 Mammographic calcification found on diagnostic imaging of breast: Secondary | ICD-10-CM | POA: Diagnosis not present

## 2016-09-22 DIAGNOSIS — C50912 Malignant neoplasm of unspecified site of left female breast: Secondary | ICD-10-CM

## 2016-09-22 DIAGNOSIS — Z170 Estrogen receptor positive status [ER+]: Secondary | ICD-10-CM | POA: Diagnosis present

## 2016-12-16 ENCOUNTER — Other Ambulatory Visit: Payer: Self-pay | Admitting: Oncology

## 2016-12-16 ENCOUNTER — Telehealth: Payer: Self-pay | Admitting: *Deleted

## 2016-12-16 DIAGNOSIS — C50912 Malignant neoplasm of unspecified site of left female breast: Secondary | ICD-10-CM

## 2016-12-16 DIAGNOSIS — Z170 Estrogen receptor positive status [ER+]: Principal | ICD-10-CM

## 2016-12-16 MED ORDER — LETROZOLE 2.5 MG PO TABS: 2.5000 mg | ORAL_TABLET | Freq: Every day | ORAL | 3 refills | Status: AC

## 2016-12-16 NOTE — Telephone Encounter (Signed)
Please call [facility] pharmacy in [place].  Patient needs authorization for refill.  [phone].  Did not leave medication name.

## 2016-12-19 NOTE — Telephone Encounter (Signed)
This has been taken care of.

## 2017-01-04 NOTE — Progress Notes (Addendum)
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  INTERVAL HISTORY: Patient returns to clinic today for routine 6 month evaluation.  She continues to feel well and is asymptomatic. She is tolerating letrozole without significant side effects. She has noticed a rash on her right breast. She denies any recent fevers or illnesses.  She has a good appetite and denies weight loss.  She has no neurologic complaints.  She denies any chest pain or shortness of breath.  She has no nausea, vomiting, constipation, or diarrhea.  She has no urinary complaints.  Patient offers no further specific complaints today.   REVIEW OF SYSTEMS:   Review of Systems  Constitutional: Negative.  Negative for fever, malaise/fatigue and weight loss.  Respiratory: Negative.  Negative for cough and shortness of breath.   Cardiovascular: Negative.  Negative for chest pain and leg swelling.  Gastrointestinal: Negative.  Negative for abdominal pain.  Genitourinary: Negative.   Musculoskeletal: Negative for joint pain.  Neurological: Negative.  Negative for sensory change and weakness.  Psychiatric/Behavioral: Negative.  The patient is not nervous/anxious.     As per HPI. Otherwise, a complete review of systems is negative.  PAST MEDICAL HISTORY: Past Medical History:  Diagnosis Date  . Breast cancer ([place]) 09/2011   Left breast, lumpectomy & radiation treatment  . Hypercholesteremia   . Hypertension     PAST SURGICAL HISTORY: Past Surgical History:  Procedure Laterality Date  . ABDOMINAL HYSTERECTOMY      FAMILY HISTORY Family History  Problem Relation Age of Onset  . Breast cancer Sister   . Osteoarthritis Sister   . Breast  cancer Other        neice       ADVANCED DIRECTIVES:    HEALTH MAINTENANCE: Social History  Substance Use Topics  . Smoking status: Never Smoker  . Smokeless tobacco: Never Used  . Alcohol use No     Colonoscopy:  PAP:  Bone density:  Lipid panel:  No Known Allergies  Current Outpatient Prescriptions  Medication Sig Dispense Refill  . aspirin 81 MG tablet Take 81 mg by mouth daily.    . Calcium-Vitamin D 600-200 MG-UNIT tablet Take by mouth.    . diphenhydrAMINE (BENADRYL) 25 MG tablet Take 25 mg by mouth every 6 (six) hours as needed.    Marland Kitchen letrozole (FEMARA) 2.5 MG tablet Take 1 tablet (2.5 mg total) by mouth daily. 90 tablet 3  . losartan-hydrochlorothiazide (HYZAAR) 50-12.5 MG per tablet     . multivitamin-iron-minerals-folic acid (CENTRUM) chewable tablet Chew 1 tablet by mouth daily.    . Omega-3 Fatty Acids (FISH OIL) 1000 MG CAPS Take by mouth.    . pravastatin (PRAVACHOL) 40 MG tablet     . ranitidine (ZANTAC) 150 MG tablet      No current facility-administered medications for this visit.     OBJECTIVE: Vitals:   01/06/17 0939  BP: 129/76  Pulse: 84  Resp: 18  Temp: 98 F (36.7 C)     There is no height or weight on file to calculate BMI.    ECOG FS:0 - Asymptomatic  General: Well-developed, well-nourished, no acute distress. Eyes: anicteric sclera. Breasts: Bilateral breast and axilla without lumps or masses.Rash noted on right breast only. Lungs:  Clear to auscultation bilaterally. Heart: Regular rate and rhythm. No rubs, murmurs, or gallops. Abdomen: Soft, nontender, nondistended. No organomegaly noted, normoactive bowel sounds. Musculoskeletal: No edema, cyanosis, or clubbing. Neuro: Alert, answering all questions appropriately. Cranial nerves grossly intact. Skin: No rashes or petechiae noted. Psych: Normal affect.   LAB RESULTS:  Lab Results  Component Value Date   NA 139 06/26/2013   K 4.4 06/26/2013   CL 101 06/26/2013   CO2 28  06/26/2013   GLUCOSE 91 06/26/2013   BUN 21 (H) 06/26/2013   CREATININE 1.09 06/26/2013   CALCIUM 10.1 06/26/2013   GFRNONAA 48 (L) 06/26/2013   GFRAA 56 (L) 06/26/2013    Lab Results  Component Value Date   WBC 5.8 06/26/2013   NEUTROABS 3.6 06/26/2013   HGB 13.0 06/26/2013   HCT 38.8 06/26/2013   MCV 96 06/26/2013   PLT 239 06/26/2013     STUDIES: No results found.  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  PLAN:    1. 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 Patient has now completed 5 years of letrozole and has been instructed to discontinue treatment.  Patient's most recent mammogram on September 22, 2016 was reported as BI-RADS 2.  Repeat in February 2019.  Return to clinic in 1 year for routine evaluation. 2. Bone density:  Dexa scan from November 2017 reported T score of -0.9 which is considered normal. Now the patient has discontinued letrozole, will defer to primary care to monitor patient's bone health. Continue calcium and vitamin D.  3. Rash: Unclear etiology. Have recommended OTC hydrocortisone cream for symptomatic treatment.  Patient expressed understanding and was in agreement with this plan. She also understands that She can call clinic at any time with any questions, concerns, or complaints.   Breast cancer   Staging form: Breast, AJCC 7th Edition     Clinical stage from 01/09/2015: Stage IA (T1c, N0, M0) - Signed by Lloyd Huger, MD on 01/09/2015  Lloyd Huger, MD 01/06/17 10:04 AM

## 2017-01-06 ENCOUNTER — Inpatient Hospital Stay: Payer: Medicare HMO | Attending: Oncology | Admitting: Oncology

## 2017-01-06 VITALS — BP 129/76 | HR 84 | Temp 98.0°F | Resp 18 | Wt 131.9 lb

## 2017-01-06 DIAGNOSIS — R21 Rash and other nonspecific skin eruption: Secondary | ICD-10-CM

## 2017-01-06 DIAGNOSIS — I10 Essential (primary) hypertension: Secondary | ICD-10-CM

## 2017-01-06 DIAGNOSIS — M1990 Unspecified osteoarthritis, unspecified site: Secondary | ICD-10-CM | POA: Diagnosis not present

## 2017-01-06 DIAGNOSIS — C50912 Malignant neoplasm of unspecified site of left female breast: Secondary | ICD-10-CM

## 2017-01-06 DIAGNOSIS — Z853 Personal history of malignant neoplasm of breast: Secondary | ICD-10-CM

## 2017-01-06 DIAGNOSIS — Z79899 Other long term (current) drug therapy: Secondary | ICD-10-CM | POA: Diagnosis not present

## 2017-01-06 DIAGNOSIS — E7800 Pure hypercholesterolemia, unspecified: Secondary | ICD-10-CM

## 2017-01-06 DIAGNOSIS — Z170 Estrogen receptor positive status [ER+]: Secondary | ICD-10-CM

## 2017-01-06 DIAGNOSIS — Z7982 Long term (current) use of aspirin: Secondary | ICD-10-CM

## 2017-01-06 NOTE — Addendum Note (Signed)
Addended by: Luretha Murphy on: 01/06/2017 10:19 AM   Modules accepted: Orders

## 2017-01-06 NOTE — Progress Notes (Signed)
Her for follow up. Stated feeling good

## 2017-09-25 ENCOUNTER — Ambulatory Visit
Admission: RE | Admit: 2017-09-25 | Discharge: 2017-09-25 | Disposition: A | Discharge status: Home / Self Care | Payer: Medicare HMO | Source: Ambulatory Visit | Attending: Oncology | Admitting: Oncology

## 2017-09-25 DIAGNOSIS — C50912 Malignant neoplasm of unspecified site of left female breast: Secondary | ICD-10-CM | POA: Diagnosis not present

## 2017-09-25 DIAGNOSIS — Z170 Estrogen receptor positive status [ER+]: Secondary | ICD-10-CM | POA: Diagnosis not present

## 2018-01-12 ENCOUNTER — Ambulatory Visit: Payer: Medicare HMO | Admitting: Oncology

## 2018-01-14 NOTE — Progress Notes (Deleted)
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  INTERVAL HISTORY: Patient returns to clinic today for routine 6 month evaluation.  She continues to feel well and is asymptomatic. She is tolerating letrozole without significant side effects. She has noticed a rash on her right breast. She denies any recent fevers or illnesses.  She has a good appetite and denies weight loss.  She has no neurologic complaints.  She denies any chest pain or shortness of breath.  She has no nausea, vomiting, constipation, or diarrhea.  She has no urinary complaints.  Patient offers no further specific complaints today.   REVIEW OF SYSTEMS:   Review of Systems  Constitutional: Negative.  Negative for fever, malaise/fatigue and weight loss.  Respiratory: Negative.  Negative for cough and shortness of breath.   Cardiovascular: Negative.  Negative for chest pain and leg swelling.  Gastrointestinal: Negative.  Negative for abdominal pain.  Genitourinary: Negative.   Musculoskeletal: Negative for joint pain.  Neurological: Negative.  Negative for sensory change and weakness.  Psychiatric/Behavioral: Negative.  The patient is not nervous/anxious.     As per HPI. Otherwise, a complete review of systems is negative.  PAST MEDICAL HISTORY: Past Medical History:  Diagnosis Date  . Breast cancer ([place]) 09/2011   Left breast, lumpectomy & radiation treatment  . Hypercholesteremia   . Hypertension     PAST SURGICAL HISTORY: Past Surgical History:  Procedure Laterality Date  . ABDOMINAL HYSTERECTOMY    . BREAST BIOPSY Left 2013   positive  . BREAST LUMPECTOMY Left 2013   F/U radiation     FAMILY HISTORY Family History    Problem Relation Age of Onset  . Breast cancer Sister   . Osteoarthritis Sister   . Breast cancer Other        neice       ADVANCED DIRECTIVES:    HEALTH MAINTENANCE: Social History   Tobacco Use  . Smoking status: Never Smoker  . Smokeless tobacco: Never Used  Substance Use Topics  . Alcohol use: No  . Drug use: No     Colonoscopy:  PAP:  Bone density:  Lipid panel:  No Known Allergies  Current Outpatient Medications  Medication Sig Dispense Refill  . aspirin 81 MG tablet Take 81 mg by mouth daily.    . Calcium-Vitamin D 600-200 MG-UNIT tablet Take by mouth.    . diphenhydrAMINE (BENADRYL) 25 MG tablet Take 25 mg by mouth every 6 (six) hours as needed.    Marland Kitchen letrozole (FEMARA) 2.5 MG tablet Take 1 tablet (2.5 mg total) by mouth daily. 90 tablet 3  . losartan-hydrochlorothiazide (HYZAAR) 50-12.5 MG per tablet     . multivitamin-iron-minerals-folic acid (CENTRUM) chewable tablet Chew 1 tablet by mouth daily.    . Omega-3 Fatty Acids (FISH OIL) 1000 MG CAPS Take by mouth.    . pravastatin (PRAVACHOL) 40 MG tablet     . ranitidine (ZANTAC) 150 MG tablet      No current facility-administered medications for this visit.     OBJECTIVE: There were no vitals filed for this visit.   There is no height or weight on file to calculate BMI.    ECOG FS:0 - Asymptomatic  General: Well-developed, well-nourished, no acute distress. Eyes: anicteric  sclera. Breasts: Bilateral breast and axilla without lumps or masses.Rash noted on right breast only. Lungs: Clear to auscultation bilaterally. Heart: Regular rate and rhythm. No rubs, murmurs, or gallops. Abdomen: Soft, nontender, nondistended. No organomegaly noted, normoactive bowel sounds. Musculoskeletal: No edema, cyanosis, or clubbing. Neuro: Alert, answering all questions appropriately. Cranial nerves grossly intact. Skin: No rashes or petechiae noted. Psych: Normal affect.   LAB RESULTS:  Lab Results  Component Value  Date   NA 139 06/26/2013   K 4.4 06/26/2013   CL 101 06/26/2013   CO2 28 06/26/2013   GLUCOSE 91 06/26/2013   BUN 21 (H) 06/26/2013   CREATININE 1.09 06/26/2013   CALCIUM 10.1 06/26/2013   GFRNONAA 48 (L) 06/26/2013   GFRAA 56 (L) 06/26/2013    Lab Results  Component Value Date   WBC 5.8 06/26/2013   NEUTROABS 3.6 06/26/2013   HGB 13.0 06/26/2013   HCT 38.8 06/26/2013   MCV 96 06/26/2013   PLT 239 06/26/2013     STUDIES: No results found.  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  PLAN:    1. 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 Patient has now completed 5 years of letrozole and has been instructed to discontinue treatment.  Patient's most recent mammogram on September 22, 2016 was reported as BI-RADS 2.  Repeat in February 2019.  Return to clinic in 1 year for routine evaluation. 2. Bone density:  Dexa scan from November 2017 reported T score of -0.9 which is considered normal. Now the patient has discontinued letrozole, will defer to primary care to monitor patient's bone health. Continue calcium and vitamin D.  3. Rash: Unclear etiology. Have recommended OTC hydrocortisone cream for symptomatic treatment.  Patient expressed understanding and was in agreement with this plan. She also understands that She can call clinic at any time with any questions, concerns, or complaints.   Breast cancer   Staging form: Breast, AJCC 7th Edition     Clinical stage from 01/09/2015: Stage IA (T1c, N0, M0) - Signed by Lloyd Huger, MD on 01/09/2015  Lloyd Huger, MD 01/14/18 11:51 PM

## 2018-01-19 ENCOUNTER — Ambulatory Visit: Payer: Medicare HMO | Admitting: Oncology

## 2022-04-08 DEATH — deceased
